# Patient Record
Sex: Female | Born: 1950 | Race: White | Hispanic: No | Marital: Married | Smoking: Former smoker
Health system: Southern US, Community
[De-identification: ages and names within clinical notes are randomized; demographics above are authoritative.]

## PROBLEM LIST (undated history)

## (undated) DIAGNOSIS — E119 Type 2 diabetes mellitus without complications: Secondary | ICD-10-CM

## (undated) DIAGNOSIS — F419 Anxiety disorder, unspecified: Secondary | ICD-10-CM

## (undated) DIAGNOSIS — E039 Hypothyroidism, unspecified: Secondary | ICD-10-CM

## (undated) DIAGNOSIS — I10 Essential (primary) hypertension: Secondary | ICD-10-CM

## (undated) DIAGNOSIS — H3530 Unspecified macular degeneration: Secondary | ICD-10-CM

## (undated) HISTORY — PX: TONSILLECTOMY: SUR1361

## (undated) HISTORY — PX: CHOLECYSTECTOMY: SHX55

---

## 1999-05-04 ENCOUNTER — Encounter: Admission: RE | Admit: 1999-05-04 | Discharge: 1999-05-04 | Payer: Self-pay | Admitting: Family Medicine

## 1999-05-04 ENCOUNTER — Encounter: Payer: Self-pay | Admitting: Family Medicine

## 2000-10-22 ENCOUNTER — Encounter: Payer: Self-pay | Admitting: *Deleted

## 2000-10-22 ENCOUNTER — Encounter: Admission: RE | Admit: 2000-10-22 | Discharge: 2000-10-22 | Payer: Self-pay | Admitting: *Deleted

## 2001-01-01 ENCOUNTER — Encounter: Admission: RE | Admit: 2001-01-01 | Discharge: 2001-04-01 | Payer: Self-pay | Admitting: *Deleted

## 2001-07-29 ENCOUNTER — Encounter: Admission: RE | Admit: 2001-07-29 | Discharge: 2001-10-27 | Payer: Self-pay | Admitting: *Deleted

## 2001-11-03 ENCOUNTER — Encounter: Admission: RE | Admit: 2001-11-03 | Discharge: 2001-11-03 | Payer: Self-pay | Admitting: Family Medicine

## 2001-11-03 ENCOUNTER — Encounter: Payer: Self-pay | Admitting: Family Medicine

## 2002-11-19 ENCOUNTER — Encounter: Admission: RE | Admit: 2002-11-19 | Discharge: 2002-11-19 | Payer: Self-pay | Admitting: Family Medicine

## 2002-11-19 ENCOUNTER — Encounter: Payer: Self-pay | Admitting: Family Medicine

## 2003-12-24 ENCOUNTER — Other Ambulatory Visit: Admission: RE | Admit: 2003-12-24 | Discharge: 2003-12-24 | Payer: Self-pay | Admitting: Family Medicine

## 2004-01-05 ENCOUNTER — Ambulatory Visit (HOSPITAL_COMMUNITY): Admission: RE | Admit: 2004-01-05 | Discharge: 2004-01-05 | Payer: Self-pay | Admitting: Family Medicine

## 2004-01-12 ENCOUNTER — Encounter: Admission: RE | Admit: 2004-01-12 | Discharge: 2004-01-12 | Payer: Self-pay | Admitting: Family Medicine

## 2004-04-20 ENCOUNTER — Encounter (INDEPENDENT_AMBULATORY_CARE_PROVIDER_SITE_OTHER): Payer: Self-pay | Admitting: Specialist

## 2004-04-20 ENCOUNTER — Ambulatory Visit (HOSPITAL_COMMUNITY): Admission: RE | Admit: 2004-04-20 | Discharge: 2004-04-20 | Payer: Self-pay | Admitting: Gastroenterology

## 2005-02-08 ENCOUNTER — Ambulatory Visit (HOSPITAL_COMMUNITY): Admission: RE | Admit: 2005-02-08 | Discharge: 2005-02-08 | Payer: Self-pay | Admitting: Family Medicine

## 2006-02-27 ENCOUNTER — Ambulatory Visit (HOSPITAL_COMMUNITY): Admission: RE | Admit: 2006-02-27 | Discharge: 2006-02-27 | Payer: Self-pay | Admitting: Family Medicine

## 2007-03-05 ENCOUNTER — Encounter: Admission: RE | Admit: 2007-03-05 | Discharge: 2007-03-05 | Payer: Self-pay | Admitting: Family Medicine

## 2007-04-08 ENCOUNTER — Other Ambulatory Visit: Admission: RE | Admit: 2007-04-08 | Discharge: 2007-04-08 | Payer: Self-pay | Admitting: Family Medicine

## 2008-03-16 ENCOUNTER — Encounter: Admission: RE | Admit: 2008-03-16 | Discharge: 2008-03-16 | Payer: Self-pay | Admitting: Family Medicine

## 2009-03-29 ENCOUNTER — Encounter: Admission: RE | Admit: 2009-03-29 | Discharge: 2009-03-29 | Payer: Self-pay | Admitting: Family Medicine

## 2010-03-30 ENCOUNTER — Other Ambulatory Visit: Payer: Self-pay | Admitting: Family Medicine

## 2010-03-30 DIAGNOSIS — Z1239 Encounter for other screening for malignant neoplasm of breast: Secondary | ICD-10-CM

## 2010-04-24 ENCOUNTER — Ambulatory Visit
Admission: RE | Admit: 2010-04-24 | Discharge: 2010-04-24 | Disposition: A | Discharge status: Home / Self Care | Payer: BC Managed Care – PPO | Source: Ambulatory Visit | Attending: Family Medicine | Admitting: Family Medicine

## 2010-04-24 DIAGNOSIS — Z1239 Encounter for other screening for malignant neoplasm of breast: Secondary | ICD-10-CM

## 2010-07-14 NOTE — Op Note (Signed)
Kristina Wood, Kristina Wood                  ACCOUNT NO.:  [id number]   MEDICAL RECORD NO.:  [id number]          PATIENT TYPE:  AMB   LOCATION:  ENDO                         FACILITY:  [facility]   PHYSICIAN:  Danise Edge, M.D.   DATE OF BIRTH:  September 14, 1950   DATE OF PROCEDURE:  04/20/2004  DATE OF DISCHARGE:                                 OPERATIVE REPORT   PROCEDURE:  Screening colonoscopy.   INDICATIONS FOR PROCEDURE:  Ms. Caralina Nop is a 60 year old female born  June 20, 1950.  Ms. Sweaney is scheduled to undergo her first screening  colonoscopy with polypectomy to prevent colon cancer.   ENDOSCOPIST:  Danise Edge, M.D.   PREMEDICATION:  Versed 8 mg, Demerol 50 mg.   DESCRIPTION OF PROCEDURE:  After obtaining informed consent, Ms. Bodkins was  placed in the left lateral decubitus position. I administered intravenous  Demerol and intravenous Versed to achieve conscious sedation for the  procedure. The patient's blood pressure, oxygen saturation and cardiac  rhythm were monitored throughout the procedure and documented in the medical  record.   Anal inspection and digital rectal exam were normal. The Olympus pediatric  video colonoscope was introduced into the rectum and advanced to the cecum.  Colonic preparation for the exam today was excellent.   RECTUM:  Normal.   SIGMOID COLON AND DESCENDING COLON:  Between 40 cm and 50 cm from the anal  verge, three 1-2 mm polyps were removed with the electrocautery snare.   SPLENIC FLEXURE:  Normal.   TRANSVERSE COLON:  Normal.   HEPATIC FLEXURE:  Normal.   ASCENDING COLON:  Normal.   CECUM AND ILEOCECAL VALVE:  From the proximal cecum, a 1 mm polyp was  removed with the cold snare.   ASSESSMENT:  A small polyp was removed from the cecum and three small polyps  were removed from the sigmoid colon. All polyps were submitted in one bottle  for pathological evaluation.      MJ/MEDQ  D:  04/20/2004  T:  04/20/2004  Job:  [id number]   cc:    Sigmund Hazel, M.D.  [address] [place], [place] [address]  Fax: [phone]

## 2011-07-12 ENCOUNTER — Encounter (INDEPENDENT_AMBULATORY_CARE_PROVIDER_SITE_OTHER): Payer: BC Managed Care – PPO | Admitting: Ophthalmology

## 2011-07-12 DIAGNOSIS — H35039 Hypertensive retinopathy, unspecified eye: Secondary | ICD-10-CM

## 2011-07-12 DIAGNOSIS — H43819 Vitreous degeneration, unspecified eye: Secondary | ICD-10-CM

## 2011-07-12 DIAGNOSIS — E1165 Type 2 diabetes mellitus with hyperglycemia: Secondary | ICD-10-CM

## 2011-07-12 DIAGNOSIS — H3530 Unspecified macular degeneration: Secondary | ICD-10-CM

## 2011-07-12 DIAGNOSIS — D3130 Benign neoplasm of unspecified choroid: Secondary | ICD-10-CM

## 2011-07-12 DIAGNOSIS — E11319 Type 2 diabetes mellitus with unspecified diabetic retinopathy without macular edema: Secondary | ICD-10-CM

## 2011-07-12 DIAGNOSIS — H2510 Age-related nuclear cataract, unspecified eye: Secondary | ICD-10-CM

## 2011-07-12 DIAGNOSIS — I10 Essential (primary) hypertension: Secondary | ICD-10-CM

## 2011-09-06 ENCOUNTER — Other Ambulatory Visit: Payer: Self-pay | Admitting: Family Medicine

## 2011-09-06 DIAGNOSIS — Z1231 Encounter for screening mammogram for malignant neoplasm of breast: Secondary | ICD-10-CM

## 2011-09-21 ENCOUNTER — Ambulatory Visit: Payer: BC Managed Care – PPO

## 2012-07-11 ENCOUNTER — Ambulatory Visit (INDEPENDENT_AMBULATORY_CARE_PROVIDER_SITE_OTHER): Payer: BC Managed Care – PPO | Admitting: Ophthalmology

## 2012-07-11 DIAGNOSIS — I10 Essential (primary) hypertension: Secondary | ICD-10-CM

## 2012-07-11 DIAGNOSIS — H35039 Hypertensive retinopathy, unspecified eye: Secondary | ICD-10-CM

## 2012-07-11 DIAGNOSIS — D3130 Benign neoplasm of unspecified choroid: Secondary | ICD-10-CM

## 2012-07-11 DIAGNOSIS — E1139 Type 2 diabetes mellitus with other diabetic ophthalmic complication: Secondary | ICD-10-CM

## 2012-07-11 DIAGNOSIS — H2510 Age-related nuclear cataract, unspecified eye: Secondary | ICD-10-CM

## 2012-07-11 DIAGNOSIS — E11319 Type 2 diabetes mellitus with unspecified diabetic retinopathy without macular edema: Secondary | ICD-10-CM

## 2012-07-11 DIAGNOSIS — H3530 Unspecified macular degeneration: Secondary | ICD-10-CM

## 2012-07-11 DIAGNOSIS — H43819 Vitreous degeneration, unspecified eye: Secondary | ICD-10-CM

## 2013-07-14 ENCOUNTER — Ambulatory Visit (INDEPENDENT_AMBULATORY_CARE_PROVIDER_SITE_OTHER): Payer: BC Managed Care – PPO | Admitting: Ophthalmology

## 2014-05-31 ENCOUNTER — Encounter (INDEPENDENT_AMBULATORY_CARE_PROVIDER_SITE_OTHER): Payer: BLUE CROSS/BLUE SHIELD | Admitting: Ophthalmology

## 2014-05-31 DIAGNOSIS — H2513 Age-related nuclear cataract, bilateral: Secondary | ICD-10-CM | POA: Diagnosis not present

## 2014-05-31 DIAGNOSIS — D3131 Benign neoplasm of right choroid: Secondary | ICD-10-CM | POA: Diagnosis not present

## 2014-05-31 DIAGNOSIS — H43813 Vitreous degeneration, bilateral: Secondary | ICD-10-CM | POA: Diagnosis not present

## 2014-05-31 DIAGNOSIS — E11329 Type 2 diabetes mellitus with mild nonproliferative diabetic retinopathy without macular edema: Secondary | ICD-10-CM

## 2014-05-31 DIAGNOSIS — H3531 Nonexudative age-related macular degeneration: Secondary | ICD-10-CM

## 2014-05-31 DIAGNOSIS — E11319 Type 2 diabetes mellitus with unspecified diabetic retinopathy without macular edema: Secondary | ICD-10-CM

## 2015-01-05 ENCOUNTER — Other Ambulatory Visit: Payer: Self-pay | Admitting: Gastroenterology

## 2015-03-15 ENCOUNTER — Encounter (HOSPITAL_COMMUNITY): Payer: Self-pay | Admitting: *Deleted

## 2015-03-21 ENCOUNTER — Encounter (HOSPITAL_COMMUNITY): Payer: Self-pay | Admitting: Anesthesiology

## 2015-03-21 NOTE — Anesthesia Preprocedure Evaluation (Addendum)
Anesthesia Evaluation  Patient identified by MRN, date of birth, ID band Patient awake    Reviewed: Allergy & Precautions, NPO status , Patient's Chart, lab work & pertinent test results  Airway Mallampati: II  TM Distance: >3 FB Neck ROM: Full    Dental no notable dental hx.    Pulmonary former smoker,    Pulmonary exam normal breath sounds clear to auscultation       Cardiovascular hypertension, Pt. on medications Normal cardiovascular exam Rhythm:Regular Rate:Normal     Neuro/Psych negative neurological ROS  negative psych ROS   GI/Hepatic negative GI ROS, Neg liver ROS,   Endo/Other  diabetes, Type 2, Oral Hypoglycemic AgentsHypothyroidism   Renal/GU negative Renal ROS  negative genitourinary   Musculoskeletal negative musculoskeletal ROS (+)   Abdominal   Peds negative pediatric ROS (+)  Hematology negative hematology ROS (+)   Anesthesia Other Findings   Reproductive/Obstetrics negative OB ROS                             Anesthesia Physical Anesthesia Plan  ASA: II  Anesthesia Plan: MAC   Post-op Pain Management:    Induction: Intravenous  Airway Management Planned: Natural Airway  Additional Equipment:   Intra-op Plan:   Post-operative Plan:   Informed Consent: I have reviewed the patients History and Physical, chart, labs and discussed the procedure including the risks, benefits and alternatives for the proposed anesthesia with the patient or authorized representative who has indicated his/her understanding and acceptance.   Dental advisory given  Plan Discussed with: CRNA  Anesthesia Plan Comments:         Anesthesia Quick Evaluation

## 2015-03-22 ENCOUNTER — Ambulatory Visit (HOSPITAL_COMMUNITY): Payer: Managed Care, Other (non HMO) | Admitting: Anesthesiology

## 2015-03-22 ENCOUNTER — Ambulatory Visit (HOSPITAL_COMMUNITY)
Admission: RE | Admit: 2015-03-22 | Discharge: 2015-03-22 | Disposition: A | Discharge status: Home / Self Care | Payer: Managed Care, Other (non HMO) | Source: Ambulatory Visit | Attending: Gastroenterology | Admitting: Gastroenterology

## 2015-03-22 ENCOUNTER — Encounter (HOSPITAL_COMMUNITY): Payer: Self-pay | Admitting: *Deleted

## 2015-03-22 ENCOUNTER — Encounter (HOSPITAL_COMMUNITY)
Admission: RE | Disposition: A | Discharge status: Home / Self Care | Payer: Self-pay | Source: Ambulatory Visit | Attending: Gastroenterology

## 2015-03-22 DIAGNOSIS — E119 Type 2 diabetes mellitus without complications: Secondary | ICD-10-CM | POA: Diagnosis not present

## 2015-03-22 DIAGNOSIS — I10 Essential (primary) hypertension: Secondary | ICD-10-CM | POA: Diagnosis not present

## 2015-03-22 DIAGNOSIS — Z86010 Personal history of colonic polyps: Secondary | ICD-10-CM | POA: Insufficient documentation

## 2015-03-22 DIAGNOSIS — D120 Benign neoplasm of cecum: Secondary | ICD-10-CM | POA: Diagnosis not present

## 2015-03-22 DIAGNOSIS — E039 Hypothyroidism, unspecified: Secondary | ICD-10-CM | POA: Insufficient documentation

## 2015-03-22 DIAGNOSIS — Z7984 Long term (current) use of oral hypoglycemic drugs: Secondary | ICD-10-CM | POA: Diagnosis not present

## 2015-03-22 DIAGNOSIS — Z87891 Personal history of nicotine dependence: Secondary | ICD-10-CM | POA: Diagnosis not present

## 2015-03-22 DIAGNOSIS — E7800 Pure hypercholesterolemia, unspecified: Secondary | ICD-10-CM | POA: Insufficient documentation

## 2015-03-22 DIAGNOSIS — Z1211 Encounter for screening for malignant neoplasm of colon: Secondary | ICD-10-CM | POA: Insufficient documentation

## 2015-03-22 HISTORY — DX: Unspecified macular degeneration: H35.30

## 2015-03-22 HISTORY — DX: Essential (primary) hypertension: I10

## 2015-03-22 HISTORY — DX: Hypothyroidism, unspecified: E03.9

## 2015-03-22 HISTORY — DX: Type 2 diabetes mellitus without complications: E11.9

## 2015-03-22 HISTORY — DX: Anxiety disorder, unspecified: F41.9

## 2015-03-22 HISTORY — PX: COLONOSCOPY WITH PROPOFOL: SHX5780

## 2015-03-22 LAB — GLUCOSE, CAPILLARY: Glucose-Capillary: 147 mg/dL — ABNORMAL HIGH (ref 65–99)

## 2015-03-22 SURGERY — COLONOSCOPY WITH PROPOFOL
Anesthesia: Monitor Anesthesia Care

## 2015-03-22 MED ORDER — PROPOFOL 500 MG/50ML IV EMUL
INTRAVENOUS | Status: DC | PRN
  Administered 2015-03-22 (×2): 30 mg via INTRAVENOUS

## 2015-03-22 MED ORDER — SODIUM CHLORIDE 0.9 % IV SOLN
INTRAVENOUS | Status: DC
Start: 2015-03-22 — End: 2015-03-22

## 2015-03-22 MED ORDER — LACTATED RINGERS IV SOLN
INTRAVENOUS | Status: DC
  Administered 2015-03-22: 1000 mL via INTRAVENOUS

## 2015-03-22 MED ORDER — PROPOFOL 500 MG/50ML IV EMUL
INTRAVENOUS | Status: DC | PRN
  Administered 2015-03-22: 100 ug/kg/min via INTRAVENOUS

## 2015-03-22 MED ORDER — PROPOFOL 10 MG/ML IV BOLUS
INTRAVENOUS | Status: AC
Start: 2015-03-22 — End: 2015-03-22
  Filled 2015-03-22: qty 40

## 2015-03-22 SURGICAL SUPPLY — 22 items

## 2015-03-22 NOTE — Transfer of Care (Signed)
Immediate Anesthesia Transfer of Care Note  Patient: Kristina Wood  Procedure(s) Performed: Procedure(s): COLONOSCOPY WITH PROPOFOL (N/A)  Patient Location: PACU  Anesthesia Type:MAC  Level of Consciousness: awake, alert  and oriented  Airway & Oxygen Therapy: Patient Spontanous Breathing and Patient connected to face mask oxygen  Post-op Assessment: Report given to RN and Post -op Vital signs reviewed and stable  Post vital signs: Reviewed and stable  Last Vitals:  Filed Vitals:   03/22/15 0740  BP: 175/86  Pulse: 76  Temp: 36.6 C  Resp: 16    Complications: No apparent anesthesia complications

## 2015-03-22 NOTE — H&P (Signed)
  Procedure: Surveillance colonoscopy. Diminutive adenomatous colon polyps removed colonoscopically in February 2006 and March 2011  History: The patient is a 65 year old female born 08-06-1950. She is scheduled to undergo a repeat surveillance colonoscopy today.  Past medical history: Hypertension. Type 2 diabetes mellitus with proteinuria. Hypercholesterolemia. Hypothyroidism.  Medication allergies: Penicillin caused hives. Sulfamethoxazole causes rash.  Exam: The patient is alert and lying comfortably on the endoscopy stretcher. Abdomen is soft and nontender to palpation. Cardiac exam reveals a regular rhythm. Lungs are clear to auscultation.  Plan: Proceed with surveillance colonoscopy

## 2015-03-22 NOTE — Op Note (Signed)
Procedure: Surveillance colonoscopy. Diminutive adenomatous colon polyps removed colonoscopically in March 2011 and February 2006  Endoscopist: Earle Gell  Premedication: Propofol administered by anesthesia  Procedure: The patient was placed in the left lateral decubitus position. Anal inspection and digital rectal exam were normal. The Pentax pediatric colonoscope was introduced into the rectum and advanced to the cecum. A normal-appearing appendiceal orifice and ileocecal valve were identified. Colonic preparation for the exam today was good. Withdrawal time was 9 minutes  Rectum. Normal. Retroflexed view of the distal rectum was normal  Sigmoid colon and descending colon. Normal  Splenic flexure. Normal  Transverse colon. Normal  Hepatic flexure. Normal  Ascending colon. Normal  Cecum and ileocecal valve. A 3 mm sessile polyp was removed from the cecum with the cold biopsy forceps  Recommendation: Schedule surveillance colonoscopy in 5 years

## 2015-03-22 NOTE — Discharge Instructions (Signed)

## 2015-03-22 NOTE — Anesthesia Postprocedure Evaluation (Signed)
Anesthesia Post Note  Patient: Kristina Wood  Procedure(s) Performed: Procedure(s) (LRB): COLONOSCOPY WITH PROPOFOL (N/A)  Patient location during evaluation: PACU Anesthesia Type: MAC Level of consciousness: awake and alert Pain management: pain level controlled Vital Signs Assessment: post-procedure vital signs reviewed and stable Respiratory status: spontaneous breathing, nonlabored ventilation, respiratory function stable and patient connected to nasal cannula oxygen Cardiovascular status: stable and blood pressure returned to baseline Anesthetic complications: no    Last Vitals:  Filed Vitals:   03/22/15 0900 03/22/15 0910  BP: 138/68 156/74  Pulse: 68 76  Temp:    Resp: 15 20    Last Pain: There were no vitals filed for this visit.               Glorya Bartley J

## 2015-03-23 ENCOUNTER — Encounter (HOSPITAL_COMMUNITY): Payer: Self-pay | Admitting: Gastroenterology

## 2015-05-31 ENCOUNTER — Ambulatory Visit (INDEPENDENT_AMBULATORY_CARE_PROVIDER_SITE_OTHER): Payer: BLUE CROSS/BLUE SHIELD | Admitting: Ophthalmology

## 2016-04-10 ENCOUNTER — Other Ambulatory Visit: Payer: Self-pay | Admitting: Nurse Practitioner

## 2016-04-10 ENCOUNTER — Other Ambulatory Visit (HOSPITAL_COMMUNITY)
Admission: RE | Admit: 2016-04-10 | Discharge: 2016-04-10 | Disposition: A | Discharge status: Home / Self Care | Payer: Managed Care, Other (non HMO) | Source: Ambulatory Visit | Attending: Nurse Practitioner | Admitting: Nurse Practitioner

## 2016-04-10 DIAGNOSIS — Z01419 Encounter for gynecological examination (general) (routine) without abnormal findings: Secondary | ICD-10-CM | POA: Insufficient documentation

## 2016-04-10 DIAGNOSIS — Z1151 Encounter for screening for human papillomavirus (HPV): Secondary | ICD-10-CM | POA: Diagnosis not present

## 2016-04-11 LAB — CYTOLOGY - PAP
Diagnosis: NEGATIVE
HPV (WINDOPATH): NOT DETECTED

## 2019-02-10 ENCOUNTER — Encounter (INDEPENDENT_AMBULATORY_CARE_PROVIDER_SITE_OTHER): Payer: BLUE CROSS/BLUE SHIELD | Admitting: Ophthalmology

## 2019-03-25 ENCOUNTER — Encounter (INDEPENDENT_AMBULATORY_CARE_PROVIDER_SITE_OTHER): Payer: BLUE CROSS/BLUE SHIELD | Admitting: Ophthalmology

## 2019-06-08 ENCOUNTER — Encounter (INDEPENDENT_AMBULATORY_CARE_PROVIDER_SITE_OTHER): Payer: BLUE CROSS/BLUE SHIELD | Admitting: Ophthalmology

## 2020-05-04 ENCOUNTER — Other Ambulatory Visit: Payer: Self-pay | Admitting: Family Medicine

## 2020-05-04 DIAGNOSIS — R410 Disorientation, unspecified: Secondary | ICD-10-CM

## 2020-05-04 DIAGNOSIS — R413 Other amnesia: Secondary | ICD-10-CM

## 2020-06-15 ENCOUNTER — Inpatient Hospital Stay: Admission: RE | Admit: 2020-06-15 | Payer: Managed Care, Other (non HMO) | Source: Ambulatory Visit

## 2020-06-27 ENCOUNTER — Ambulatory Visit
Admission: RE | Admit: 2020-06-27 | Discharge: 2020-06-27 | Disposition: A | Discharge status: Home / Self Care | Payer: Managed Care, Other (non HMO) | Source: Ambulatory Visit | Attending: Family Medicine | Admitting: Family Medicine

## 2020-06-27 ENCOUNTER — Other Ambulatory Visit: Payer: Self-pay

## 2020-06-27 DIAGNOSIS — R413 Other amnesia: Secondary | ICD-10-CM

## 2020-06-27 DIAGNOSIS — R410 Disorientation, unspecified: Secondary | ICD-10-CM

## 2020-06-27 MED ORDER — GADOBENATE DIMEGLUMINE 529 MG/ML IV SOLN
15.0000 mL | Freq: Once | INTRAVENOUS | Status: AC | PRN
  Administered 2020-06-27: 15 mL via INTRAVENOUS

## 2020-07-13 ENCOUNTER — Ambulatory Visit: Payer: Managed Care, Other (non HMO) | Admitting: Registered"

## 2022-03-12 ENCOUNTER — Ambulatory Visit: Payer: Self-pay | Admitting: Dietician

## 2022-04-13 ENCOUNTER — Ambulatory Visit: Payer: Self-pay | Admitting: Dietician

## 2022-05-15 IMAGING — MR MR HEAD WO/W CM
12 series · 48 of 48 positions shown · IV contrast (multihance)
Comparison: None.

CLINICAL DATA: Confusion and memory loss

EXAM:
MRI HEAD WITHOUT AND WITH CONTRAST
TECHNIQUE: Multiplanar, multiecho pulse sequences of the brain and surrounding
structures were obtained without and with intravenous contrast.
CONTRAST:  15mL MULTIHANCE GADOBENATE DIMEGLUMINE 529 MG/ML IV SOLN

[Series 3: T1 · sagittal · 5.0mm · 0.45mm/px · 2 of 26 slices shown]
[im 1/26]
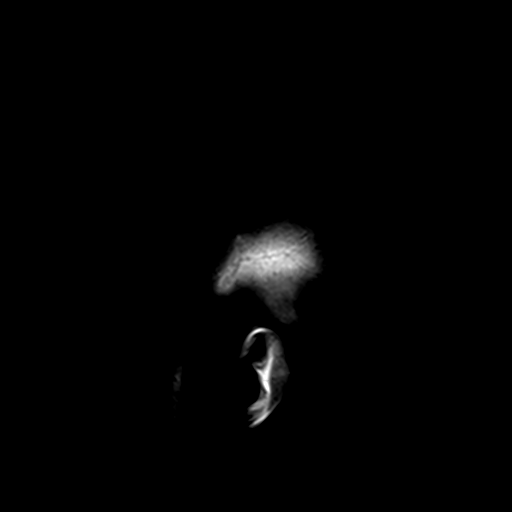
[im 26/26]
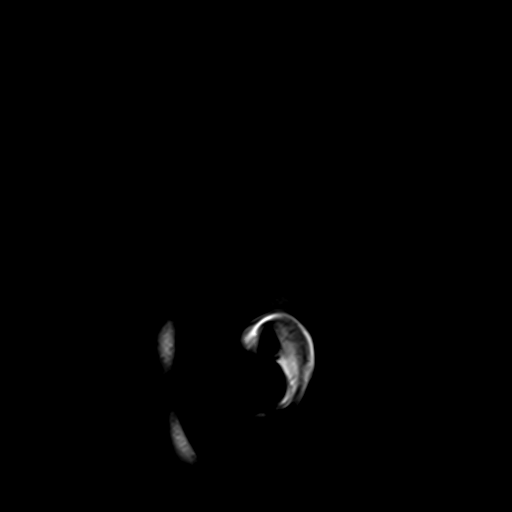

[Series 4: DWI · axial · 3.0mm · 1.80mm/px · z∈[-6,+135]mm · 6 of 104 slices shown (1 of 4)]
[im 1/104]
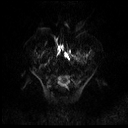
[im 21/104]
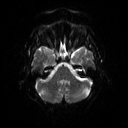
[im 42/104]
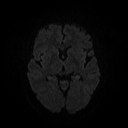
[im 62/104]
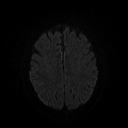
[im 83/104]
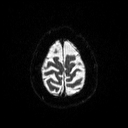
[im 104/104]
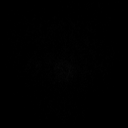

[Series 5: DWI · axial · 3.0mm · 1.80mm/px · z∈[-6,+135]mm · 3 of 50 slices shown (2 of 4)]
[im 1/50]
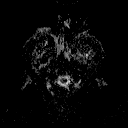
[im 25/50]
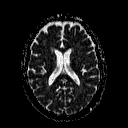
[im 50/50]
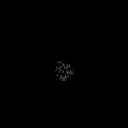

[Series 6: DWI · coronal · 5.0mm · 1.80mm/px · 4 of 73 slices shown (3 of 4)]
[im 1/73]
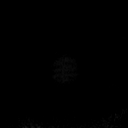
[im 25/73]
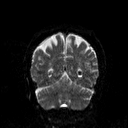
[im 49/73]
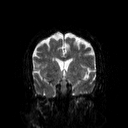
[im 73/73]
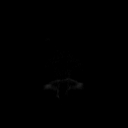

[Series 7: DWI · coronal · 5.0mm · 1.80mm/px · 2 of 38 slices shown (4 of 4)]
[im 1/38]
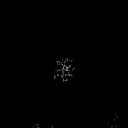
[im 38/38]
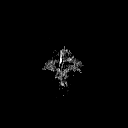

[Series 8: T2 · axial · 5.0mm · 0.60mm/px · 1 of 22 slices shown (1 of 2)]
[im 1/22]
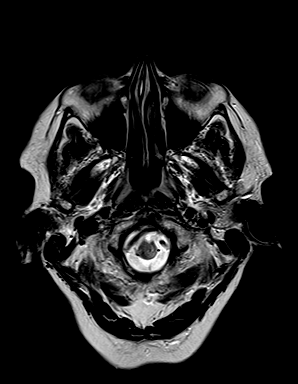

[Series 9: FLAIR · axial · 3.0mm · 0.45mm/px · z∈[-62,+105]mm · 2 of 37 slices shown]
[im 1/37]
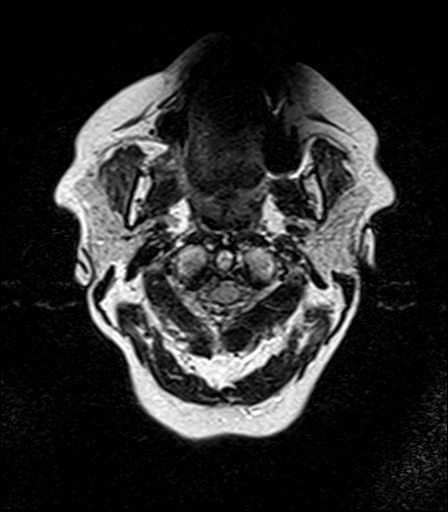
[im 37/37]
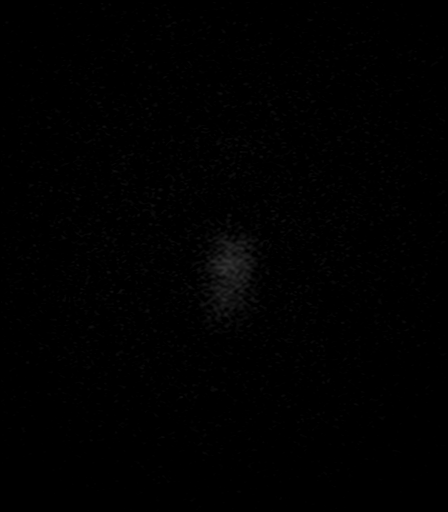

[Series 11: swi_images · axial · 4.0mm · 0.90mm/px · z∈[-55,+101]mm · 2 of 40 slices shown]
[im 1/40]
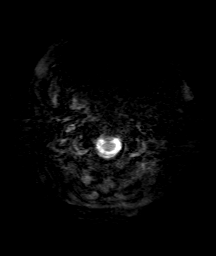
[im 40/40]
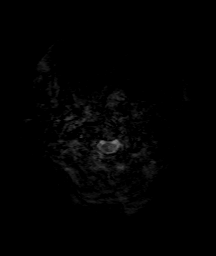

[Series 12: t1_mpr_tra · axial · 1.0mm · 0.75mm/px · z∈[-64,+110]mm · 11 of 176 slices shown]
[im 1/176]
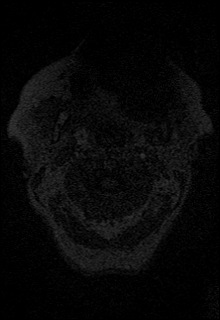
[im 18/176]
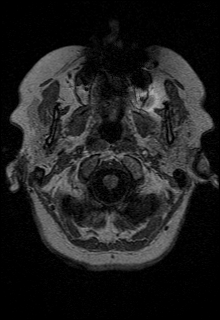
[im 36/176]
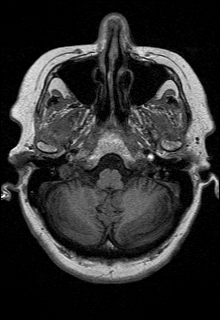
[im 53/176]
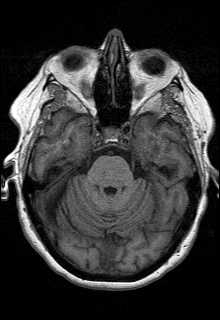
[im 71/176]
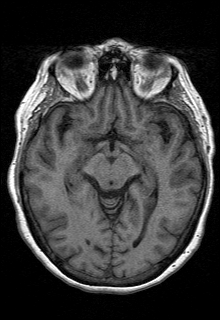
[im 88/176]
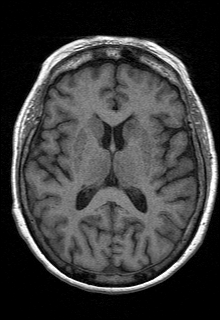
[im 106/176]
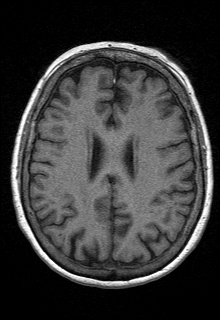
[im 123/176]
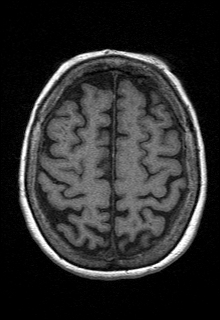
[im 141/176]
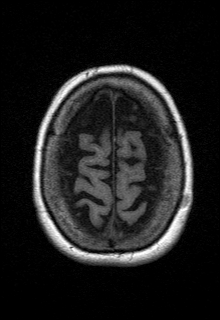
[im 158/176]
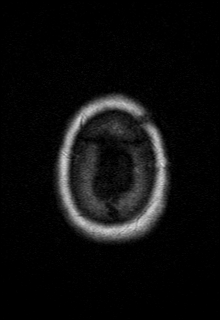
[im 176/176]
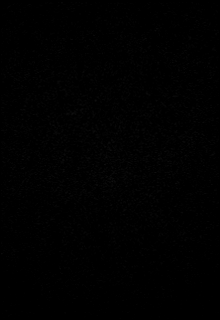

[Series 13: T2 · coronal · 5.0mm · 0.45mm/px · 2 of 31 slices shown (2 of 2)]
[im 1/31]
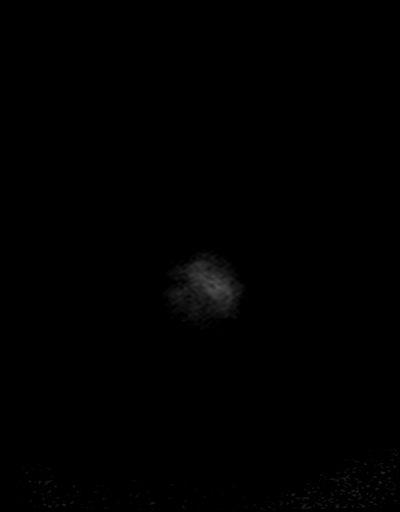
[im 31/31]
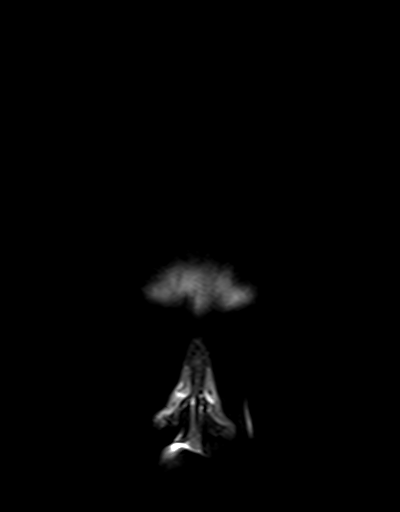

[Series 14: t1_mpr_tra post · axial · 1.0mm · 0.75mm/px · z∈[-64,+110]mm · 11 of 176 slices shown]
[im 1/176]
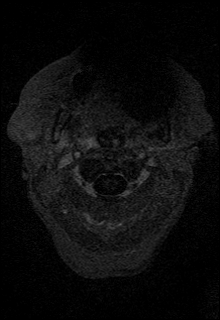
[im 18/176]
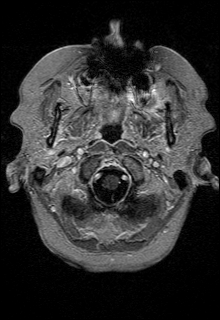
[im 36/176]
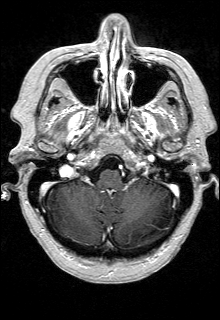
[im 53/176]
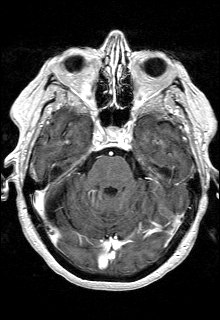
[im 71/176]
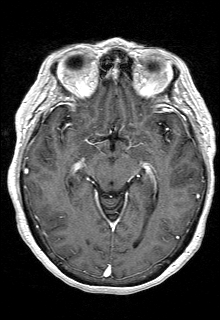
[im 88/176]
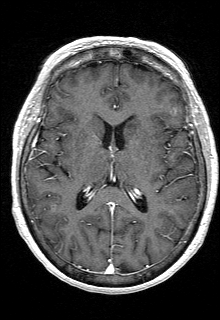
[im 106/176]
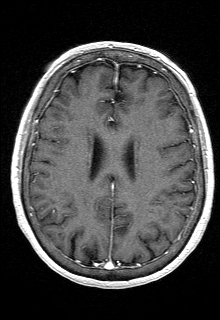
[im 123/176]
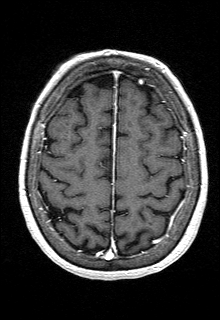
[im 141/176]
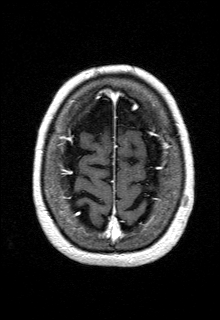
[im 158/176]
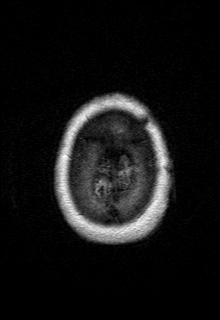
[im 176/176]
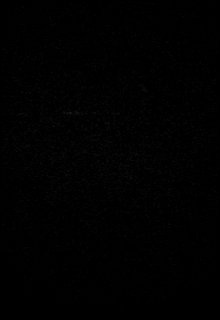

[Series 15: post cor · coronal · 5.0mm · 0.45mm/px · 2 of 31 slices shown]
[im 1/31]
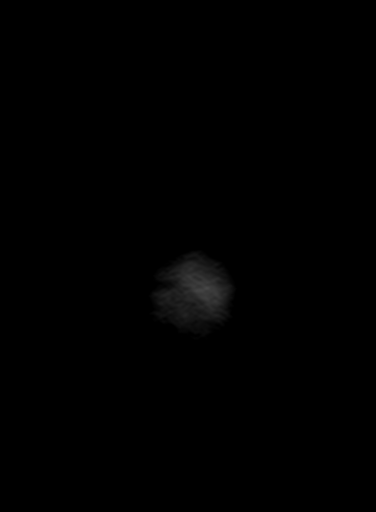
[im 31/31]
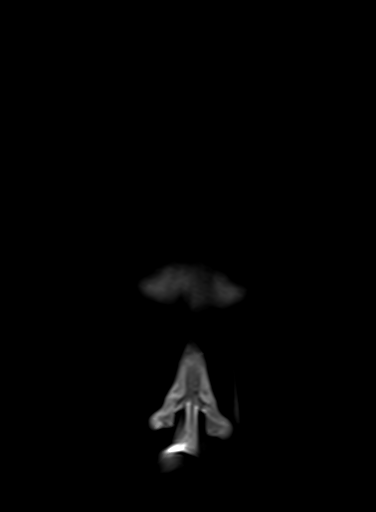

[48 of 48 positions shown; findings below may reference images not displayed]

FINDINGS: Brain: Ventricle size and cerebral volume normal for age. Mild to
moderate white matter changes with scattered deep white matter
hyperintensities bilaterally. Mild hyperintensity in the pons
bilaterally. Negative for hemorrhage or mass lesion. Negative for
acute infarct.

Vascular: Normal arterial flow voids

Skull and upper cervical spine: No focal skeletal lesion.

Sinuses/Orbits: Paranasal sinuses clear.  Negative orbit.

Other: Bilateral scalp lesions most consistent with Corpuz cyst.
IMPRESSION: No acute abnormality. Mild to moderate white matter changes most
compatible chronic microvascular ischemia. Negative for acute
infarct.
